# Patient Record
Sex: Male | Born: 1940 | Race: White | Hispanic: No | Smoking: Never smoker
Health system: Southern US, Community
[De-identification: ages and names within clinical notes are randomized; demographics above are authoritative.]

## PROBLEM LIST (undated history)

## (undated) DIAGNOSIS — K219 Gastro-esophageal reflux disease without esophagitis: Secondary | ICD-10-CM

## (undated) DIAGNOSIS — I4891 Unspecified atrial fibrillation: Secondary | ICD-10-CM

## (undated) DIAGNOSIS — I059 Rheumatic mitral valve disease, unspecified: Secondary | ICD-10-CM

## (undated) DIAGNOSIS — Z7901 Long term (current) use of anticoagulants: Secondary | ICD-10-CM

## (undated) DIAGNOSIS — C61 Malignant neoplasm of prostate: Secondary | ICD-10-CM

## (undated) DIAGNOSIS — M109 Gout, unspecified: Secondary | ICD-10-CM

## (undated) DIAGNOSIS — E039 Hypothyroidism, unspecified: Secondary | ICD-10-CM

## (undated) DIAGNOSIS — K635 Polyp of colon: Secondary | ICD-10-CM

## (undated) DIAGNOSIS — I472 Ventricular tachycardia, unspecified: Secondary | ICD-10-CM

## (undated) DIAGNOSIS — I5022 Chronic systolic (congestive) heart failure: Secondary | ICD-10-CM

## (undated) DIAGNOSIS — G2581 Restless legs syndrome: Secondary | ICD-10-CM

## (undated) DIAGNOSIS — L570 Actinic keratosis: Secondary | ICD-10-CM

## (undated) DIAGNOSIS — N183 Chronic kidney disease, stage 3 unspecified: Secondary | ICD-10-CM

## (undated) DIAGNOSIS — L219 Seborrheic dermatitis, unspecified: Secondary | ICD-10-CM

## (undated) DIAGNOSIS — E559 Vitamin D deficiency, unspecified: Secondary | ICD-10-CM

## (undated) DIAGNOSIS — I4892 Unspecified atrial flutter: Secondary | ICD-10-CM

## (undated) DIAGNOSIS — I739 Peripheral vascular disease, unspecified: Secondary | ICD-10-CM

## (undated) DIAGNOSIS — Z95810 Presence of automatic (implantable) cardiac defibrillator: Secondary | ICD-10-CM

## (undated) DIAGNOSIS — Z8673 Personal history of transient ischemic attack (TIA), and cerebral infarction without residual deficits: Secondary | ICD-10-CM

## (undated) DIAGNOSIS — I4729 Other ventricular tachycardia: Secondary | ICD-10-CM

## (undated) DIAGNOSIS — Z85828 Personal history of other malignant neoplasm of skin: Secondary | ICD-10-CM

## (undated) DIAGNOSIS — M899 Disorder of bone, unspecified: Secondary | ICD-10-CM

## (undated) DIAGNOSIS — D696 Thrombocytopenia, unspecified: Secondary | ICD-10-CM

## (undated) DIAGNOSIS — Z4682 Encounter for fitting and adjustment of non-vascular catheter: Secondary | ICD-10-CM

## (undated) DIAGNOSIS — D649 Anemia, unspecified: Secondary | ICD-10-CM

## (undated) DIAGNOSIS — M949 Disorder of cartilage, unspecified: Secondary | ICD-10-CM

## (undated) DIAGNOSIS — G459 Transient cerebral ischemic attack, unspecified: Secondary | ICD-10-CM

## (undated) DIAGNOSIS — M8580 Other specified disorders of bone density and structure, unspecified site: Secondary | ICD-10-CM

## (undated) DIAGNOSIS — C189 Malignant neoplasm of colon, unspecified: Secondary | ICD-10-CM

## (undated) DIAGNOSIS — G4733 Obstructive sleep apnea (adult) (pediatric): Secondary | ICD-10-CM

## (undated) DIAGNOSIS — R7302 Impaired glucose tolerance (oral): Secondary | ICD-10-CM

## (undated) DIAGNOSIS — I779 Disorder of arteries and arterioles, unspecified: Secondary | ICD-10-CM

## (undated) DIAGNOSIS — C801 Malignant (primary) neoplasm, unspecified: Secondary | ICD-10-CM

## (undated) HISTORY — PX: CARDIAC DEFIBRILLATOR PLACEMENT: SHX171

---

## 2011-11-18 DIAGNOSIS — Z8673 Personal history of transient ischemic attack (TIA), and cerebral infarction without residual deficits: Secondary | ICD-10-CM | POA: Insufficient documentation

## 2011-11-18 DIAGNOSIS — I480 Paroxysmal atrial fibrillation: Secondary | ICD-10-CM | POA: Insufficient documentation

## 2012-04-19 DIAGNOSIS — Z950 Presence of cardiac pacemaker: Secondary | ICD-10-CM | POA: Insufficient documentation

## 2012-04-19 DIAGNOSIS — I5022 Chronic systolic (congestive) heart failure: Secondary | ICD-10-CM | POA: Insufficient documentation

## 2012-05-31 DIAGNOSIS — E039 Hypothyroidism, unspecified: Secondary | ICD-10-CM | POA: Insufficient documentation

## 2013-12-14 DIAGNOSIS — Z95810 Presence of automatic (implantable) cardiac defibrillator: Secondary | ICD-10-CM | POA: Insufficient documentation

## 2015-12-22 ENCOUNTER — Ambulatory Visit
Admission: EM | Admit: 2015-12-22 | Discharge: 2015-12-22 | Disposition: A | Discharge status: Home / Self Care | Payer: Medicare Other | Attending: Emergency Medicine | Admitting: Emergency Medicine

## 2015-12-22 DIAGNOSIS — R58 Hemorrhage, not elsewhere classified: Secondary | ICD-10-CM | POA: Diagnosis not present

## 2015-12-22 HISTORY — DX: Actinic keratosis: L57.0

## 2015-12-22 HISTORY — DX: Vitamin D deficiency, unspecified: E55.9

## 2015-12-22 HISTORY — DX: Transient cerebral ischemic attack, unspecified: G45.9

## 2015-12-22 HISTORY — DX: Ventricular tachycardia: I47.2

## 2015-12-22 HISTORY — DX: Ventricular tachycardia, unspecified: I47.20

## 2015-12-22 HISTORY — DX: Long term (current) use of anticoagulants: Z79.01

## 2015-12-22 HISTORY — DX: Personal history of transient ischemic attack (TIA), and cerebral infarction without residual deficits: Z86.73

## 2015-12-22 HISTORY — DX: Malignant neoplasm of colon, unspecified: C18.9

## 2015-12-22 HISTORY — DX: Restless legs syndrome: G25.81

## 2015-12-22 HISTORY — DX: Obstructive sleep apnea (adult) (pediatric): G47.33

## 2015-12-22 HISTORY — DX: Unspecified atrial flutter: I48.92

## 2015-12-22 HISTORY — DX: Anemia, unspecified: D64.9

## 2015-12-22 HISTORY — DX: Polyp of colon: K63.5

## 2015-12-22 HISTORY — DX: Disorder of cartilage, unspecified: M94.9

## 2015-12-22 HISTORY — DX: Malignant neoplasm of prostate: C61

## 2015-12-22 HISTORY — DX: Thrombocytopenia, unspecified: D69.6

## 2015-12-22 HISTORY — DX: Malignant (primary) neoplasm, unspecified: C80.1

## 2015-12-22 HISTORY — DX: Gout, unspecified: M10.9

## 2015-12-22 HISTORY — DX: Hypothyroidism, unspecified: E03.9

## 2015-12-22 HISTORY — DX: Impaired glucose tolerance (oral): R73.02

## 2015-12-22 HISTORY — DX: Chronic kidney disease, stage 3 unspecified: N18.30

## 2015-12-22 HISTORY — DX: Chronic kidney disease, stage 3 (moderate): N18.3

## 2015-12-22 HISTORY — DX: Disorder of arteries and arterioles, unspecified: I77.9

## 2015-12-22 HISTORY — DX: Chronic systolic (congestive) heart failure: I50.22

## 2015-12-22 HISTORY — DX: Presence of automatic (implantable) cardiac defibrillator: Z95.810

## 2015-12-22 HISTORY — DX: Other specified disorders of bone density and structure, unspecified site: M85.80

## 2015-12-22 HISTORY — DX: Peripheral vascular disease, unspecified: I73.9

## 2015-12-22 HISTORY — DX: Seborrheic dermatitis, unspecified: L21.9

## 2015-12-22 HISTORY — DX: Unspecified atrial fibrillation: I48.91

## 2015-12-22 HISTORY — DX: Disorder of bone, unspecified: M89.9

## 2015-12-22 HISTORY — DX: Gastro-esophageal reflux disease without esophagitis: K21.9

## 2015-12-22 HISTORY — DX: Rheumatic mitral valve disease, unspecified: I05.9

## 2015-12-22 HISTORY — DX: Personal history of other malignant neoplasm of skin: Z85.828

## 2015-12-22 HISTORY — DX: Other ventricular tachycardia: I47.29

## 2015-12-22 HISTORY — DX: Encounter for fitting and adjustment of non-vascular catheter: Z46.82

## 2015-12-22 NOTE — ED Provider Notes (Signed)
. HPI  SUBJECTIVE:  Peter Wang is a 75 y.o. male who presents with persistent postoperative bleeding. 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 He is on aliquots for atrial fibrillation. He is states that he has replace the bandages over 3 times, states that the bleeding "is not slowing down". He denies fevers, swelling. He reports mild pain described as soreness at the surgery site, but this is not changed since his surgery. He has tried changing his dressing, applying direct pressure for 20 minutes, which she states didn't help. He has also tried petroleum jelly. There are no other aggravating or alleviating factors. Patient has a extensive past medical history including atrial fibrillation/A flutter, status post defibrillator, prostate cancer, chronic kidney disease stage 3, melanoma. No history of diabetes, hypertension. Patient states that he had the surgery done at the [facility] dermatology clinic by Dr. Marolyn Hammock. PMD: Dr. Raul Del.    Past Medical History:  Diagnosis Date  . Actinic keratosis   . AICD (automatic cardioverter/defibrillator) present   . Anemia   . Anticoagulant long-term use   . 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 Atrial fibrillation ([place])   . Atrial flutter ([place])   . Automatic implantable cardioverter-defibrillator in situ   . Cancer ([place])   . Cancer of prostate ([place])   . Carotid artery disease ([place])   . Chronic kidney disease, stage III (moderate)   . Chronic systolic heart failure ([place])   . Decreased platelet count ([place])   . Disorder of arteries and arterioles ([place])   . Disorder of bone and cartilage   . Encounter for biliary drainage tube placement   . Gastroesophageal reflux disease   . Gout   . H/O malignant neoplasm of skin   . H/O transient cerebral ischemia   . Hypothyroidism   . Impaired glucose tolerance   . Malignant neoplasm of colon ([place])   . Malignant neoplasm of prostate ([place])   . Mitral valve disorder   . Obstructive sleep  apnea syndrome   . Osteopenia   . Paroxysmal ventricular tachycardia ([place])   . Polyp of colon   . Restless leg syndrome   . Seborrheic dermatitis   . Thrombocytopenia ([place])   . TIA (transient ischemic attack)   . Ventricular tachyarrhythmia ([place])   . Vitamin D deficiency     History reviewed. No pertinent surgical history.  History reviewed. No pertinent family history.  Social History  Substance Use Topics  . Smoking status: Never Smoker  . Smokeless tobacco: Never Used  . Alcohol use No    No current facility-administered medications for this encounter.   Current Outpatient Prescriptions:  .  albuterol-ipratropium (COMBIVENT) 18-103 MCG/ACT inhaler, Inhale into the lungs every 4 (four) hours., Disp: , Rfl:  .  alclomethasone (ACLOVATE) 0.05 % cream, Apply topically as needed., Disp: , Rfl:  .  allopurinol (ZYLOPRIM) 300 MG tablet, Take 300 mg by mouth daily., Disp: , Rfl:  .  ALPRAZolam (XANAX) 0.25 MG tablet, Take 0.25 mg by mouth at bedtime as needed for anxiety., Disp: , Rfl:  .  amiodarone (PACERONE) 200 MG tablet, Take 200 mg by mouth as needed., Disp: , Rfl:  .  apixaban (ELIQUIS) 5 MG TABS tablet, Take 5 mg by mouth as needed., Disp: , Rfl:  .  aspirin 81 MG chewable tablet, Chew by mouth daily., Disp: , Rfl:  .  benzonatate (TESSALON) 100 MG capsule, Take by mouth 3 (three) times daily as needed  for cough., Disp: , Rfl:  .  calcium carbonate (OSCAL) 1500 (600 Ca) MG TABS tablet, Take 600 mg of elemental calcium by mouth 4 (four) times daily., Disp: , Rfl:  .  carvedilol (COREG) 25 MG tablet, Take 25 mg by mouth as needed., Disp: , Rfl:  .  chlorpheniramine (CHLOR-TRIMETON) 4 MG tablet, Take 4 mg by mouth as needed for allergies., Disp: , Rfl:  .  cholecalciferol (VITAMIN D) 400 units TABS tablet, Take 2,000 Units by mouth daily., Disp: , Rfl:  .  clindamycin (CLINDAGEL) 1 % gel, Apply topically 2 (two) times daily., Disp: , Rfl:  .  cyanocobalamin 1000 MCG tablet,  Take 1,000 mcg by mouth daily., Disp: , Rfl:  .  desonide (DESOWEN) 0.05 % lotion, Apply topically 2 (two) times daily., Disp: , Rfl:  .  digoxin (LANOXIN) 0.25 MG tablet, Take 250 mcg by mouth daily., Disp: , Rfl:  .  diltiazem (DILACOR XR) 120 MG 24 hr capsule, Take 120 mg by mouth daily., Disp: , Rfl:  .  docusate sodium (COLACE) 100 MG capsule, Take 100 mg by mouth 2 (two) times daily., Disp: , Rfl:  .  dronedarone (MULTAQ) 400 MG tablet, Take 400 mg by mouth as needed., Disp: , Rfl:  .  enalapril (VASOTEC) 2.5 MG tablet, Take 2.5 mg by mouth daily., Disp: , Rfl:  .  enoxaparin (LOVENOX) 100 MG/ML injection, Inject 100 mg into the skin., Disp: , Rfl:  .  ferrous sulfate 325 (65 FE) MG tablet, Take 325 mg by mouth daily with breakfast., Disp: , Rfl:  .  flecainide (TAMBOCOR) 50 MG tablet, Take 50 mg by mouth 2 (two) times daily., Disp: , Rfl:  .  fluocinonide cream (LIDEX) [id number] %, Apply 1 application topically 2 (two) times daily., Disp: , Rfl:  .  fluorouracil (EFUDEX) 5 % cream, Apply topically 2 (two) times daily., Disp: , Rfl:  .  fluticasone (FLONASE) 50 MCG/ACT nasal spray, Place into both nostrils daily., Disp: , Rfl:  .  Fluticasone-Salmeterol (ADVAIR) 250-50 MCG/DOSE AEPB, Inhale 1 puff into the lungs 2 (two) times daily., Disp: , Rfl:  .  furosemide (LASIX) 40 MG tablet, Take 40 mg by mouth., Disp: , Rfl:  .  HYDROcodone-acetaminophen (NORCO/VICODIN) 5-325 MG tablet, Take 1 tablet by mouth as needed for moderate pain., Disp: , Rfl:  .  hydrocortisone 2.5 % cream, Apply topically 2 (two) times daily., Disp: , Rfl:  .  ketoconazole (NIZORAL) 2 % shampoo, Apply 1 application topically as needed for irritation., Disp: , Rfl:  .  leuprolide, 6 Month, (ELIGARD) 45 MG injection, Inject 45 mg into the skin every 6 (six) months., Disp: , Rfl:  .  levothyroxine (SYNTHROID, LEVOTHROID) 75 MCG tablet, Take 75 mcg by mouth daily before breakfast., Disp: , Rfl:  .  loratadine (CLARITIN) 10 MG  tablet, Take 10 mg by mouth daily., Disp: , Rfl:  .  magnesium oxide (MAG-OX) 400 MG tablet, Take 400 mg by mouth daily., Disp: , Rfl:  .  metoprolol succinate (TOPROL-XL) 50 MG 24 hr tablet, Take 50 mg by mouth daily. Take with or immediately following a meal., Disp: , Rfl:  .  mexiletine (MEXITIL) 150 MG capsule, Take 150 mg by mouth daily., Disp: , Rfl:  .  Miconazole-Zinc Oxide-Petrolat 0.25-15-81.35 % OINT, Apply topically 2 (two) times daily., Disp: , Rfl:  .  nystatin (MYCOSTATIN/NYSTOP) powder, Apply topically 2 (two) times daily., Disp: , Rfl:  .  omeprazole (PRILOSEC) 40 MG capsule,  Take 40 mg by mouth daily., Disp: , Rfl:  .  ranitidine (ZANTAC) 150 MG tablet, Take 150 mg by mouth 2 (two) times daily., Disp: , Rfl:  .  salicylic acid 6 % gel, Apply 1 application topically 2 (two) times daily., Disp: , Rfl:  .  Sulfacetamide Sodium-Sulfur 10-5 % LOTN, Apply topically 3 times/day as needed-between meals & bedtime., Disp: , Rfl:   Allergies  Allergen Reactions  . Benadryl [Diphenhydramine Hcl] Other (See Comments)    Shakes   . Ibuprofen Other (See Comments)    Confusion  . Interferons Rash  . Oxycodone Rash     ROS  As noted in HPI.   Physical Exam  BP (!) 145/60 (BP Location: Right Arm)   Pulse 70   Temp 97.6 F (36.4 C) (Tympanic)   Resp 18   Ht 5\' 6"  (1.676 m)   Wt 189 lb (85.7 kg)   SpO2 100%   BMI 30.51 kg/m   Constitutional: Well developed, well nourished, no acute distress Eyes:  EOMI, conjunctiva normal bilaterally HENT: Normocephalic, atraumatic,mucus membranes moist. Positive wheezing underneath the bandage at [facility], bandage partially soaked through on the cheek.  Respiratory: Normal inspiratory effort Cardiovascular: Normal rate GI: nondistended skin: No rash, skin intact Musculoskeletal: no deformities Neurologic: Alert & oriented x 3, no focal neuro deficits Psychiatric: Speech and behavior appropriate   ED Course   Medications - No data  to display  No orders of the defined types were placed in this encounter.   No results found for this or any previous visit (from the past 24 hour(s)). No results found.  ED Clinical Impression  Bleeding   ED Assessment/Plan  Discussed case with Dr. Venia Carbon, dermatology resident on call. He advised removing the bandages and to use silver nitrate or any other hemostatic agent if patient has persistent bleeding. removed bandages, patient has stable clot in the upper lesion does not appear to have any active bleeding, and the lower incision appears to be  healing excellently. No purulent drainage, erythema, tenderness. No signs of infection. He does have a small nontender bruise inferior to the laceration.  Leaving the clot as a biologic bandage.  He is not bleeding from the incision on his cheek. Bacitracin and pressure dressing over the wound on the cheek as well. Follow-up with dermatology on Monday. Go to the ER for recurrent bleeding that does not stop after 20 minutes of direct pressure. Patient agrees with plan.   *This clinic note was created using Dragon dictation software. Therefore, there may be occasional mistakes despite careful proofreading.  ?   Melynda Ripple, MD 12/22/15 1736

## 2015-12-22 NOTE — Discharge Instructions (Signed)
Sleep with your head elevated, do not touch the bandage if at all possible. If it starts bleeding again, apply pressure for 20 minutes. If this does not resolve it, then go to the emergency department. Also go to the emergency department for fevers above 100.4, pain not controlled with medications, or other concerns. Otherwise follow-up with your dermatologist on Monday.

## 2015-12-22 NOTE — ED Triage Notes (Signed)
Patient had dermatology surgery on Thursday on his face, he presents today with dressings in place that are covered with some dried drainage. He would like to have the bandages changed if possible.

## 2016-11-16 ENCOUNTER — Ambulatory Visit
Admission: EM | Admit: 2016-11-16 | Discharge: 2016-11-16 | Disposition: A | Discharge status: Home / Self Care | Payer: Medicare Other | Attending: Emergency Medicine | Admitting: Emergency Medicine

## 2016-11-16 ENCOUNTER — Ambulatory Visit: Payer: Medicare Other

## 2016-11-16 DIAGNOSIS — K219 Gastro-esophageal reflux disease without esophagitis: Secondary | ICD-10-CM | POA: Diagnosis not present

## 2016-11-16 DIAGNOSIS — Z8673 Personal history of transient ischemic attack (TIA), and cerebral infarction without residual deficits: Secondary | ICD-10-CM | POA: Diagnosis not present

## 2016-11-16 DIAGNOSIS — Z85038 Personal history of other malignant neoplasm of large intestine: Secondary | ICD-10-CM | POA: Insufficient documentation

## 2016-11-16 DIAGNOSIS — M109 Gout, unspecified: Secondary | ICD-10-CM | POA: Insufficient documentation

## 2016-11-16 DIAGNOSIS — R509 Fever, unspecified: Secondary | ICD-10-CM | POA: Diagnosis present

## 2016-11-16 DIAGNOSIS — Z7982 Long term (current) use of aspirin: Secondary | ICD-10-CM | POA: Insufficient documentation

## 2016-11-16 DIAGNOSIS — Z95810 Presence of automatic (implantable) cardiac defibrillator: Secondary | ICD-10-CM | POA: Insufficient documentation

## 2016-11-16 DIAGNOSIS — Z8546 Personal history of malignant neoplasm of prostate: Secondary | ICD-10-CM | POA: Diagnosis not present

## 2016-11-16 DIAGNOSIS — I5022 Chronic systolic (congestive) heart failure: Secondary | ICD-10-CM | POA: Insufficient documentation

## 2016-11-16 DIAGNOSIS — Z85828 Personal history of other malignant neoplasm of skin: Secondary | ICD-10-CM | POA: Diagnosis not present

## 2016-11-16 DIAGNOSIS — G4733 Obstructive sleep apnea (adult) (pediatric): Secondary | ICD-10-CM | POA: Diagnosis not present

## 2016-11-16 DIAGNOSIS — I4891 Unspecified atrial fibrillation: Secondary | ICD-10-CM | POA: Insufficient documentation

## 2016-11-16 DIAGNOSIS — Z86010 Personal history of colonic polyps: Secondary | ICD-10-CM | POA: Insufficient documentation

## 2016-11-16 DIAGNOSIS — J188 Other pneumonia, unspecified organism: Secondary | ICD-10-CM | POA: Insufficient documentation

## 2016-11-16 DIAGNOSIS — J189 Pneumonia, unspecified organism: Secondary | ICD-10-CM

## 2016-11-16 DIAGNOSIS — E039 Hypothyroidism, unspecified: Secondary | ICD-10-CM | POA: Diagnosis not present

## 2016-11-16 DIAGNOSIS — E559 Vitamin D deficiency, unspecified: Secondary | ICD-10-CM | POA: Diagnosis not present

## 2016-11-16 DIAGNOSIS — M8580 Other specified disorders of bone density and structure, unspecified site: Secondary | ICD-10-CM | POA: Diagnosis not present

## 2016-11-16 DIAGNOSIS — I4892 Unspecified atrial flutter: Secondary | ICD-10-CM | POA: Diagnosis not present

## 2016-11-16 DIAGNOSIS — N183 Chronic kidney disease, stage 3 (moderate): Secondary | ICD-10-CM | POA: Diagnosis not present

## 2016-11-16 DIAGNOSIS — I2510 Atherosclerotic heart disease of native coronary artery without angina pectoris: Secondary | ICD-10-CM | POA: Insufficient documentation

## 2016-11-16 DIAGNOSIS — Z7901 Long term (current) use of anticoagulants: Secondary | ICD-10-CM | POA: Insufficient documentation

## 2016-11-16 DIAGNOSIS — G2581 Restless legs syndrome: Secondary | ICD-10-CM | POA: Diagnosis not present

## 2016-11-16 DIAGNOSIS — J181 Lobar pneumonia, unspecified organism: Secondary | ICD-10-CM

## 2016-11-16 LAB — URINALYSIS, COMPLETE (UACMP) WITH MICROSCOPIC
Bacteria, UA: NONE SEEN
GLUCOSE, UA: NEGATIVE mg/dL
HGB URINE DIPSTICK: NEGATIVE
KETONES UR: NEGATIVE mg/dL
LEUKOCYTES UA: NEGATIVE
NITRITE: NEGATIVE
Specific Gravity, Urine: 1.02 (ref 1.005–1.030)
pH: 5.5 (ref 5.0–8.0)

## 2016-11-16 LAB — CBC WITH DIFFERENTIAL/PLATELET
BASOS ABS: 0.1 10*3/uL (ref 0–0.1)
Basophils Relative: 1 %
EOS ABS: 0 10*3/uL (ref 0–0.7)
EOS PCT: 0 %
HCT: 37.5 % — ABNORMAL LOW (ref 40.0–52.0)
Hemoglobin: 12.8 g/dL — ABNORMAL LOW (ref 13.0–18.0)
LYMPHS PCT: 6 %
Lymphs Abs: 0.6 10*3/uL — ABNORMAL LOW (ref 1.0–3.6)
MCH: 32.4 pg (ref 26.0–34.0)
MCHC: 34.2 g/dL (ref 32.0–36.0)
MCV: 94.9 fL (ref 80.0–100.0)
Monocytes Absolute: 1.1 10*3/uL — ABNORMAL HIGH (ref 0.2–1.0)
Monocytes Relative: 11 %
Neutro Abs: 8 10*3/uL — ABNORMAL HIGH (ref 1.4–6.5)
Neutrophils Relative %: 82 %
PLATELETS: 98 10*3/uL — AB (ref 150–440)
RBC: 3.96 MIL/uL — ABNORMAL LOW (ref 4.40–5.90)
RDW: 14.5 % (ref 11.5–14.5)
WBC: 9.7 10*3/uL (ref 3.8–10.6)

## 2016-11-16 LAB — BASIC METABOLIC PANEL
ANION GAP: 8 (ref 5–15)
BUN: 27 mg/dL — ABNORMAL HIGH (ref 6–20)
CALCIUM: 8.7 mg/dL — AB (ref 8.9–10.3)
CO2: 22 mmol/L (ref 22–32)
Chloride: 101 mmol/L (ref 101–111)
Creatinine, Ser: 1.41 mg/dL — ABNORMAL HIGH (ref 0.61–1.24)
GFR calc Af Amer: 54 mL/min — ABNORMAL LOW (ref >60)
GFR calc non Af Amer: 47 mL/min — ABNORMAL LOW (ref >60)
GLUCOSE: 135 mg/dL — AB (ref 65–99)
Potassium: 4.6 mmol/L (ref 3.5–5.1)
Sodium: 131 mmol/L — ABNORMAL LOW (ref 135–145)

## 2016-11-16 MED ORDER — DOXYCYCLINE HYCLATE 100 MG PO CAPS
100.0000 mg | ORAL_CAPSULE | Freq: Two times a day (BID) | ORAL | 0 refills | Status: DC
Start: 2016-11-16 — End: 2024-01-12

## 2016-11-16 MED ORDER — ACETAMINOPHEN 325 MG PO TABS
650.0000 mg | ORAL_TABLET | Freq: Once | ORAL | Status: AC
  Administered 2016-11-16: 650 mg via ORAL

## 2016-11-16 NOTE — ED Provider Notes (Signed)
[facility] ____________________________________________  Time seen: Approximately 11:55 AM  I have reviewed the triage vital signs and the nursing notes.   HISTORY  Chief Complaint Chills and Fever   HPI Peter Wang is a 76 y.o. male with a history of A. fib, a flutter, CAD, anemia, chronic renal disease, CHF, thrombocytopenia, colon cancer, prostate cancer with prostatectomy, presenting with his wife at bedside for evaluation of fever and chills for the last 4 days at home. Reports some intermittent complaints of weakness, denies current weakness. States did not measure temperature at home as a thermometer is not working. Reports has been taking Tylenol about twice a day. Has not taken any Tylenol prior to arrival. Reports was recently treated for urinary tract infection, and in discussing with patient patient took the 250 mg Cipro once a day for 14 days instead of twice a day for 7. Reports urinary symptoms including urgency and burning with urination has fully resolved. Denies any urinary complaints at this time.  Reports patient has had cough and some chest congestion for the last 1-2 weeks. Reports felt like it was just from working outside, states occasionally cough is productive. Denies sinus pain, sinus pressure or sore throat.reports has continued but does not feel like he has as much energy as normal. Denies rash or skin changes. Denies fall or injury. Reports has had multiple ticks on him lately, denies known tick bite or tick attachment. denies aggravating or alleviating factors.   Denies chest pain, shortness of breath, abdominal pain, dysuria, extremity pain, extremity swelling or rash.   Leigh-Fleming, Mary Sella, MD: PCP    Past Medical History:  Diagnosis Date  . Actinic keratosis   . AICD (automatic cardioverter/defibrillator) present   . Anemia   . Anticoagulant long-term use   . Arterial disease ([place])   . Atrial fibrillation ([place])   . Atrial flutter ([place])     . Automatic implantable cardioverter-defibrillator in situ   . Cancer ([place])   . Cancer of prostate ([place])   . Carotid artery disease ([place])   . Chronic kidney disease, stage III (moderate)   . Chronic systolic heart failure ([place])   . Decreased platelet count ([place])   . Disorder of arteries and arterioles ([employer])   . Disorder of bone and cartilage   . Encounter for biliary drainage tube placement   . Gastroesophageal reflux disease   . Gout   . H/O malignant neoplasm of skin   . H/O transient cerebral ischemia   . Hypothyroidism   . Impaired glucose tolerance   . Malignant neoplasm of colon ([place])   . Malignant neoplasm of prostate ([place])   . Mitral valve disorder   . Obstructive sleep apnea syndrome   . Osteopenia   . Paroxysmal ventricular tachycardia ([place])   . Polyp of colon   . Restless leg syndrome   . Seborrheic dermatitis   . Thrombocytopenia ([place])   . TIA (transient ischemic attack)   . Ventricular tachyarrhythmia ([place])   . Vitamin D deficiency     There are no active problems to display for this patient.   Past Surgical History:  Procedure Laterality Date  . CARDIAC DEFIBRILLATOR PLACEMENT       No current facility-administered medications for this encounter.   Current Outpatient Prescriptions:  .  albuterol-ipratropium (COMBIVENT) 18-103 MCG/ACT inhaler, Inhale into the lungs every 4 (four) hours., Disp: , Rfl:  .  alclomethasone (ACLOVATE) 0.05 % cream, Apply topically as needed., Disp: , Rfl:  .  allopurinol (ZYLOPRIM) 300 MG tablet, Take 300 mg by mouth daily., Disp: , Rfl:  .  ALPRAZolam (XANAX) 0.25 MG tablet, Take 0.25 mg by mouth at bedtime as needed for anxiety., Disp: , Rfl:  .  amiodarone (PACERONE) 200 MG tablet, Take 200 mg by mouth as needed., Disp: , Rfl:  .  apixaban (ELIQUIS) 5 MG TABS tablet, Take 5 mg by mouth as needed., Disp: , Rfl:  .  aspirin 81 MG chewable tablet, Chew by mouth daily., Disp: , Rfl:  .  benzonatate (TESSALON) 100 MG capsule,  Take by mouth 3 (three) times daily as needed for cough., Disp: , Rfl:  .  calcium carbonate (OSCAL) 1500 (600 Ca) MG TABS tablet, Take 600 mg of elemental calcium by mouth 4 (four) times daily., Disp: , Rfl:  .  carvedilol (COREG) 25 MG tablet, Take 25 mg by mouth as needed., Disp: , Rfl:  .  chlorpheniramine (CHLOR-TRIMETON) 4 MG tablet, Take 4 mg by mouth as needed for allergies., Disp: , Rfl:  .  cholecalciferol (VITAMIN D) 400 units TABS tablet, Take 2,000 Units by mouth daily., Disp: , Rfl:  .  clindamycin (CLINDAGEL) 1 % gel, Apply topically 2 (two) times daily., Disp: , Rfl:  .  cyanocobalamin 1000 MCG tablet, Take 1,000 mcg by mouth daily., Disp: , Rfl:  .  desonide (DESOWEN) 0.05 % lotion, Apply topically 2 (two) times daily., Disp: , Rfl:  .  digoxin (LANOXIN) 0.25 MG tablet, Take 250 mcg by mouth daily., Disp: , Rfl:  .  diltiazem (DILACOR XR) 120 MG 24 hr capsule, Take 120 mg by mouth daily., Disp: , Rfl:  .  docusate sodium (COLACE) 100 MG capsule, Take 100 mg by mouth 2 (two) times daily., Disp: , Rfl:  .  doxycycline (VIBRAMYCIN) 100 MG capsule, Take 1 capsule (100 mg total) by mouth 2 (two) times daily., Disp: 20 capsule, Rfl: 0 .  dronedarone (MULTAQ) 400 MG tablet, Take 400 mg by mouth as needed., Disp: , Rfl:  .  enalapril (VASOTEC) 2.5 MG tablet, Take 2.5 mg by mouth daily., Disp: , Rfl:  .  enoxaparin (LOVENOX) 100 MG/ML injection, Inject 100 mg into the skin., Disp: , Rfl:  .  ferrous sulfate 325 (65 FE) MG tablet, Take 325 mg by mouth daily with breakfast., Disp: , Rfl:  .  flecainide (TAMBOCOR) 50 MG tablet, Take 50 mg by mouth 2 (two) times daily., Disp: , Rfl:  .  fluocinonide cream (LIDEX) 2.72 %, Apply 1 application topically 2 (two) times daily., Disp: , Rfl:  .  fluorouracil (EFUDEX) 5 % cream, Apply topically 2 (two) times daily., Disp: , Rfl:  .  fluticasone (FLONASE) 50 MCG/ACT nasal spray, Place into both nostrils daily., Disp: , Rfl:  .  Fluticasone-Salmeterol  (ADVAIR) 250-50 MCG/DOSE AEPB, Inhale 1 puff into the lungs 2 (two) times daily., Disp: , Rfl:  .  furosemide (LASIX) 40 MG tablet, Take 40 mg by mouth., Disp: , Rfl:  .  HYDROcodone-acetaminophen (NORCO/VICODIN) 5-325 MG tablet, Take 1 tablet by mouth as needed for moderate pain., Disp: , Rfl:  .  hydrocortisone 2.5 % cream, Apply topically 2 (two) times daily., Disp: , Rfl:  .  ketoconazole (NIZORAL) 2 % shampoo, Apply 1 application topically as needed for irritation., Disp: , Rfl:  .  leuprolide, 6 Month, (ELIGARD) 45 MG injection, Inject 45 mg into the skin every 6 (six) months., Disp: , Rfl:  .  levothyroxine (SYNTHROID, LEVOTHROID) 75 MCG tablet, Take  75 mcg by mouth daily before breakfast., Disp: , Rfl:  .  loratadine (CLARITIN) 10 MG tablet, Take 10 mg by mouth daily., Disp: , Rfl:  .  magnesium oxide (MAG-OX) 400 MG tablet, Take 400 mg by mouth daily., Disp: , Rfl:  .  metoprolol succinate (TOPROL-XL) 50 MG 24 hr tablet, Take 50 mg by mouth daily. Take with or immediately following a meal., Disp: , Rfl:  .  mexiletine (MEXITIL) 150 MG capsule, Take 150 mg by mouth daily., Disp: , Rfl:  .  Miconazole-Zinc Oxide-Petrolat 0.25-15-81.35 % OINT, Apply topically 2 (two) times daily., Disp: , Rfl:  .  nystatin (MYCOSTATIN/NYSTOP) powder, Apply topically 2 (two) times daily., Disp: , Rfl:  .  omeprazole (PRILOSEC) 40 MG capsule, Take 40 mg by mouth daily., Disp: , Rfl:  .  ranitidine (ZANTAC) 150 MG tablet, Take 150 mg by mouth 2 (two) times daily., Disp: , Rfl:  .  salicylic acid 6 % gel, Apply 1 application topically 2 (two) times daily., Disp: , Rfl:  .  Sulfacetamide Sodium-Sulfur 10-5 % LOTN, Apply topically 3 times/day as needed-between meals & bedtime., Disp: , Rfl:   Allergies Benadryl [diphenhydramine hcl]; Ibuprofen; Interferons; and Oxycodone  family history Mother: DM   Social History Social History  Substance Use Topics  . Smoking status: Never Smoker  . Smokeless tobacco:  Never Used  . Alcohol use No    Review of Systems Constitutional: as above. Eyes: No visual changes. ENT: No sore throat. Cardiovascular: Denies chest pain. Respiratory: Denies shortness of breath. Gastrointestinal: No abdominal pain.  No nausea, no vomiting.  No diarrhea.   Genitourinary: Negative for dysuria. Reports some urinary incontinence at baseline. Musculoskeletal: Negative for back pain. Skin: Negative for rash.   ____________________________________________   PHYSICAL EXAM:  VITAL SIGNS: ED Triage Vitals [11/16/16 1118]  Enc Vitals Group     BP (!) 129/58     Pulse Rate 69     Resp (!) 24     Temp (!) 100.8 F (38.2 C)     Temp Source Oral     SpO2 100 %     Weight 180 lb (81.6 kg)     Height 5\' 7"  (1.702 m)     Head Circumference      Peak Flow      Pain Score      Pain Loc      Pain Edu?      Excl. in [place]?    Vitals:   11/16/16 1118 11/16/16 1120 11/16/16 1122 11/16/16 1350  BP: (!) 129/58 124/70 (!) 123/49 (!) 111/56  Pulse: 69 70 69 64  Resp: (!) 24   18  Temp: (!) 100.8 F (38.2 C)   99.4 F (37.4 C)  TempSrc: Oral   Oral  SpO2: 100%   99%  Weight: 180 lb (81.6 kg)     Height: 5\' 7"  (1.702 m)       Constitutional: Alert and oriented. Well appearing and in no acute distress. Eyes: Conjunctivae are normal. ENT      Head: Normocephalic and atraumatic.No sinus tenderness to palpation.       Nose: No congestion/rhinnorhea.      Mouth/Throat: Mucous membranes are moist.Oropharynx non-erythematous. Neck: No stridor. Supple without meningismus.  Hematological/Lymphatic/Immunilogical: No cervical lymphadenopathy. Cardiovascular: Normal rate, regular rhythm. Grossly normal heart sounds.  Good peripheral circulation. Respiratory: Normal respiratory effort without tachypnea nor retractions. occasional dry cough noted in room. Mild scattered rhonchi. No wheezes. Speaks in complete  sentences.  Gastrointestinal: Soft and nontender. Normal Bowel sounds.  No CVA tenderness. Musculoskeletal: No midline cervical, thoracic or lumbar tenderness to palpation. Ambulatory with steady gait.       Right lower leg:  No tenderness or edema.      Left lower leg:  No tenderness or edema.  Neurologic:  Normal speech and language.  Speech is normal. No gait instability.  Skin:  Skin is warm, dry and intact. No rash noted. Psychiatric: Mood and affect are normal. Speech and behavior are normal. Patient exhibits appropriate insight and judgment   ___________________________________________   LABS (all labs ordered are listed, but only abnormal results are displayed)  Labs Reviewed  URINALYSIS, COMPLETE (UACMP) WITH MICROSCOPIC - Abnormal; Notable for the following:       Result Value   Color, Urine AMBER (*)    Bilirubin Urine SMALL (*)    Protein, ur TRACE (*)    Squamous Epithelial / LPF 0-5 (*)    All other components within normal limits  CBC WITH DIFFERENTIAL/PLATELET - Abnormal; Notable for the following:    RBC 3.96 (*)    Hemoglobin 12.8 (*)    HCT 37.5 (*)    Platelets 98 (*)    Neutro Abs 8.0 (*)    Lymphs Abs 0.6 (*)    Monocytes Absolute 1.1 (*)    All other components within normal limits  BASIC METABOLIC PANEL - Abnormal; Notable for the following:    Sodium 131 (*)    Glucose, Bld 135 (*)    BUN 27 (*)    Creatinine, Ser 1.41 (*)    Calcium 8.7 (*)    GFR calc non Af Amer 47 (*)    GFR calc Af Amer 54 (*)    All other components within normal limits  ROCKY MTN SPOTTED FVR ABS PNL(IGG+IGM)  B. BURGDORFI ANTIBODIES   Labs appear to be similar to patient's baseline when compared to labs via care everywhere.   RADIOLOGY  Dg Chest 2 View  Result Date: 11/16/2016 CLINICAL DATA:  Cough and congestion. EXAM: CHEST  2 VIEW COMPARISON:  None. FINDINGS: Mild cardiomegaly. The hila and mediastinum are normal. A 2 lead AICD device is identified. No pneumothorax. A healed rib fractures seen laterally on the right. Underlying opacity is  probably thickened pleural from previous trauma. There also appears to be opacity behind the right hemidiaphragm, located in the right lower lobe on the lateral view consistent with infiltrate. No other acute abnormalities are identified. IMPRESSION: Suspected right lower lobe pneumonia. Recommend short-term follow-up to ensure resolution. Healed rib fractures on the right. Associated underlying opacity is likely pleural thickening. Electronically Signed   By: Dorise Bullion III M.D   On: 11/16/2016 13:20   ____________________________________________   PROCEDURES Procedures     INITIAL IMPRESSION / ASSESSMENT AND PLAN / ED COURSE  Pertinent labs & imaging results that were available during my care of the patient were reviewed by me and considered in my medical decision making (see chart for details).  Well-appearing patient. No acute distress. Tylenol given in urgent care. Urinalysis overall unremarkable, doubt UTI. CBC and BMP results reviewed and discussed in detail with patient and spouse, suspect similar to patient baseline as compared to care everywhere labs, and patient and spouse reports him or to patient baseline. Will evaluate chest x-ray as well as send for RMSF and Lyme disease labs as recent ticks found on him.   Chest x-ray per radiologist suspected right lower lobe pneumonia  as above. As patient with chronic renal insufficiency, recently treated with fluoroquinolone, concern for pneumonia as well as tick exposures, will treat patient empirically with oral doxycycline. Denies need for cough medication. Encouraged rest, fluids, supportive care, Tylenol as needed and very close primary care follow-up. Encouraged follow-up with primary care physician in 2-3 days. Discussed strict follow-up and return parameters including returning to urgent care proceed and directed to the ER for any continued fever, shortness of breath, chest pain, weakness or worsening concerns. Discussed indication,  risks and benefits of medications with patient.  Discussed follow up with Primary care physician this week. Discussed follow up and return parameters including no resolution or any worsening concerns. Patient verbalized understanding and agreed to plan.   ____________________________________________   FINAL CLINICAL IMPRESSION(S) / ED DIAGNOSES  Final diagnoses:  Fever, unspecified  Community acquired pneumonia of right lower lobe of lung ([place])     Discharge Medication List as of 11/16/2016  1:58 PM    START taking these medications   Details  doxycycline (VIBRAMYCIN) 100 MG capsule Take 1 capsule (100 mg total) by mouth 2 (two) times daily., Starting Sun 11/16/2016, Normal        Note: This dictation was prepared with Dragon dictation along with smaller phrase technology. Any transcriptional errors that result from this process are unintentional.         Marylene Land, NP 11/16/16 1811

## 2016-11-16 NOTE — Discharge Instructions (Signed)
Take medication as prescribed. Rest. Drink plenty of fluids.  ° °Follow up with your primary care physician this week as needed. Return to Urgent care for new or worsening concerns.  ° °

## 2016-11-16 NOTE — ED Triage Notes (Signed)
Pt was treated for UTI 10 days and finished ABX and feeling better regarding urinary sx. Fever/chills past 4 days. One episode of feeling lightheaded a few days ago but none since.

## 2016-11-18 LAB — B. BURGDORFI ANTIBODIES

## 2016-11-18 LAB — ROCKY MTN SPOTTED FVR ABS PNL(IGG+IGM)
RMSF IGG: NEGATIVE
RMSF IGM: 0.19 {index} (ref 0.00–0.89)

## 2017-01-04 DIAGNOSIS — I77810 Thoracic aortic ectasia: Secondary | ICD-10-CM | POA: Insufficient documentation

## 2017-12-22 IMAGING — CR DG CHEST 2V
3 series · 3 of 3 positions shown · non-contrast
Comparison: None.

CLINICAL DATA: Cough and congestion.

EXAM:
CHEST  2 VIEW

[chest pa (1 of 2)]
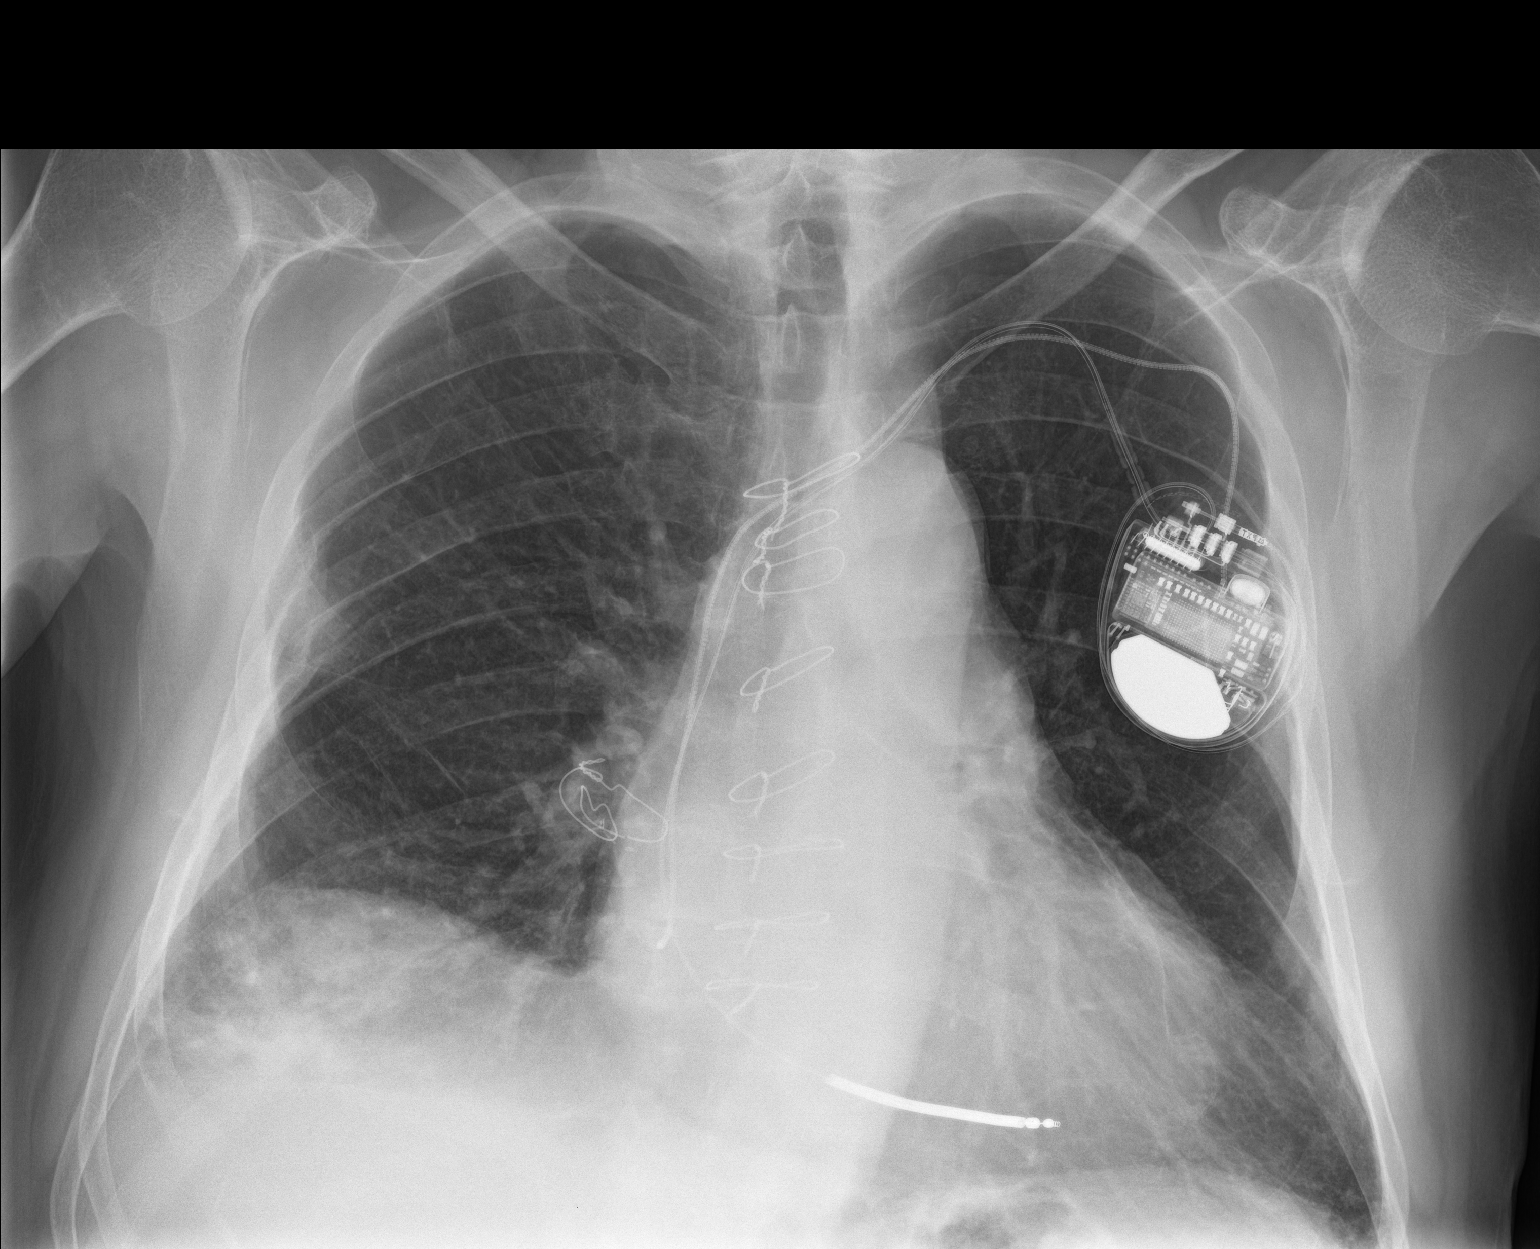

[chest pa (2 of 2)]
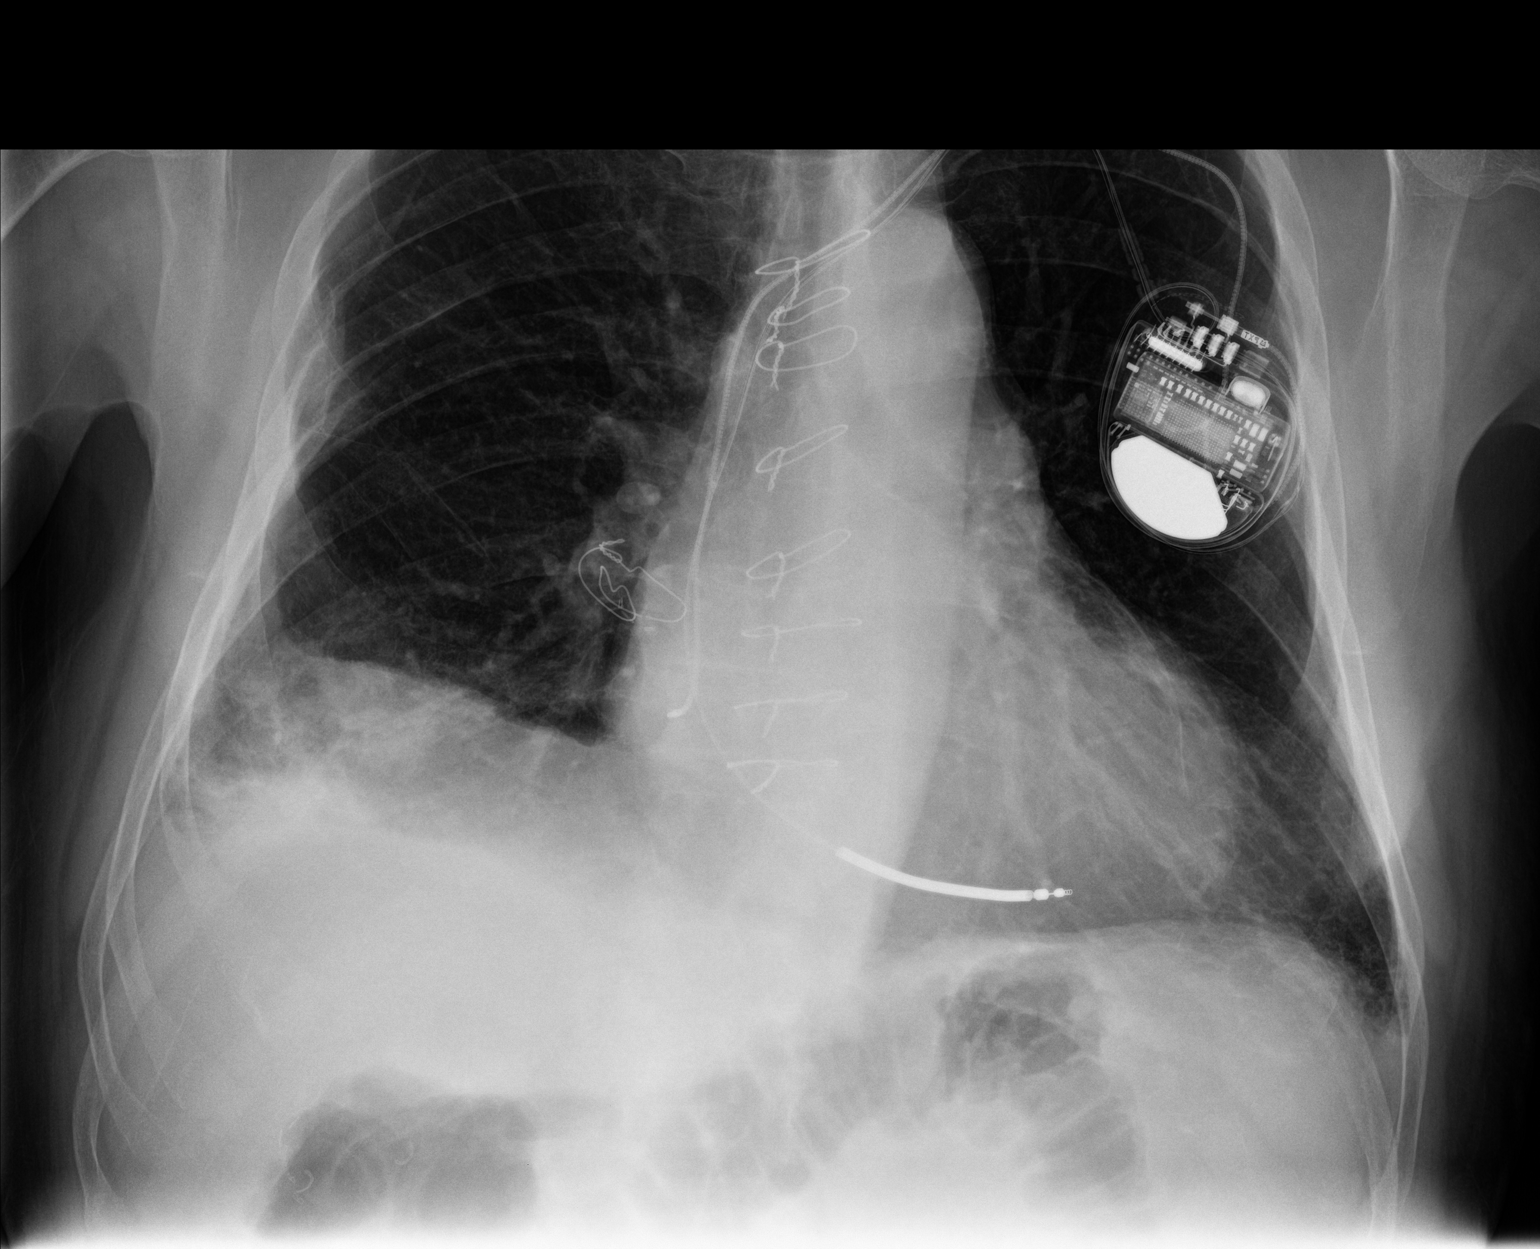

[chest lat]
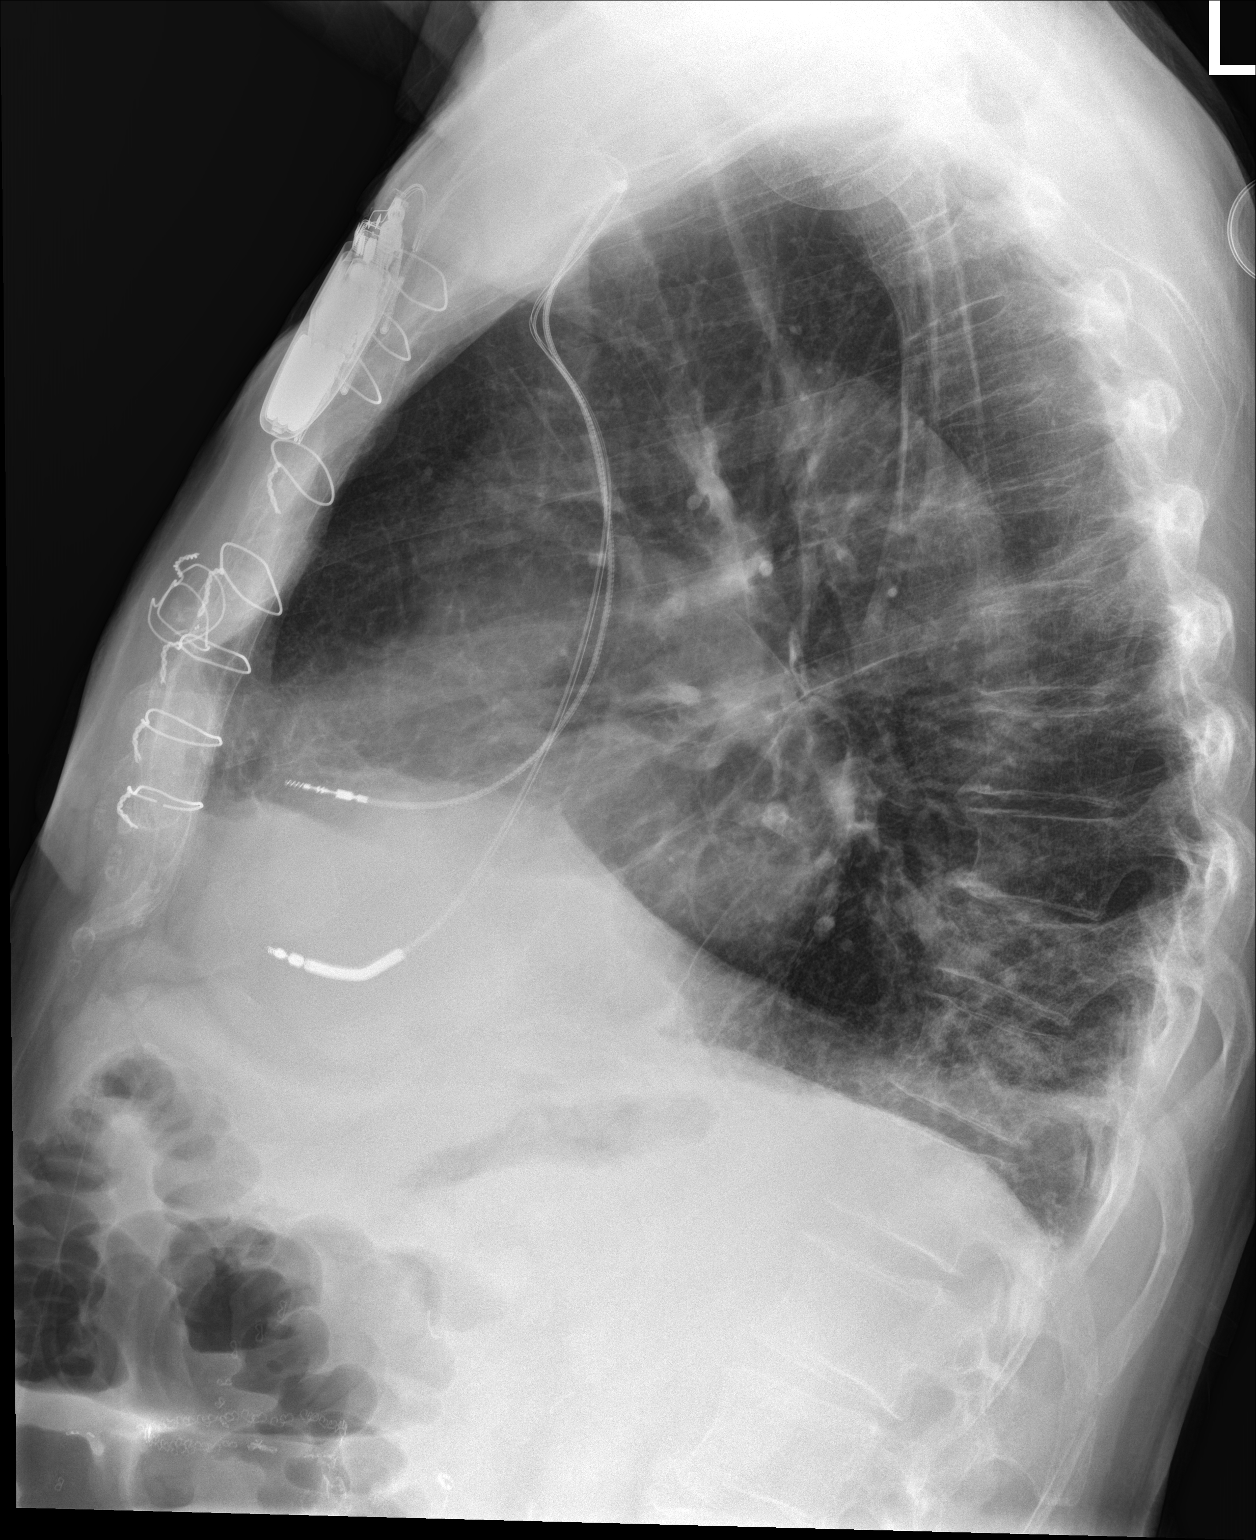

[3 of 3 positions shown; findings below may reference images not displayed]

FINDINGS: Mild cardiomegaly. The hila and mediastinum are normal. A 2 lead
AICD device is identified. No pneumothorax. A healed rib fractures
seen laterally on the right. Underlying opacity is probably
thickened pleural from previous trauma. There also appears to be
opacity behind the right hemidiaphragm, located in the right lower
lobe on the lateral view consistent with infiltrate. No other acute
abnormalities are identified.
IMPRESSION: Suspected right lower lobe pneumonia. Recommend short-term follow-up
to ensure resolution.

Healed rib fractures on the right. Associated underlying opacity is
likely pleural thickening.

## 2020-08-21 DIAGNOSIS — F411 Generalized anxiety disorder: Secondary | ICD-10-CM | POA: Insufficient documentation

## 2024-01-11 NOTE — Progress Notes (Signed)
 Sleep Medicine   Office Visit  Patient Name: Peter Wang DOB: [birth year] MRN [id number]    Chief Complaint: Sleep consult  Brief History:  Peter Wang presents for an initial consult to establish care for sleep evaluation, with a reported at least a 3 year history of snoring and witnessed apnea.  Patient reports his sleep quality is fair. This is noted most nights. The patient's bed partner reports  snoring and witnessed apnea at night. The patient relates the following symptoms: slow to start in the morning, snoring that wakes him, EDS, brain fog and lack of focus are also present. The patient goes to sleep at 10:00pm-12:00am and wakes up at 6:00am and gets out of bed 8:00am. Sleep quality is the same when outside home environment.  Patient has noted does not compliance of his legs at night.  The patient  relates no unusual behavior during the night.  The patient anxiety as a history of psychiatric problems. The Epworth Sleepiness Score is 4 out of 24 .  The patient relates  Cardiovascular risk factors include: AICD, afib, HF, hx of CVA. The patient reports taking a daily nap of 1 hour after lunch.   ROS  General: (-) fever, (-) chills, (-) night sweat Nose and Sinuses: (-) nasal stuffiness or itchiness, (-) postnasal drip, (-) nosebleeds, (-) sinus trouble. Mouth and Throat: (-) sore throat, (-) hoarseness. Neck: (-) swollen glands, (-) enlarged thyroid, (-) neck pain. Respiratory: - cough, - shortness of breath, + wheezing. Neurologic: - numbness, - tingling. Psychiatric: + anxiety, - depression Sleep behavior: -sleep paralysis -hypnogogic hallucinations -dream enactment      -vivid dreams -cataplexy -night terrors -sleep walking   Current Medication: Outpatient Encounter Medications as of 01/12/2024  Medication Sig   albuterol-ipratropium (COMBIVENT) 18-103 MCG/ACT inhaler Inhale into the lungs every 4 (four) hours.   alclomethasone (ACLOVATE) 0.05 % cream Apply topically as needed.    allopurinol (ZYLOPRIM) 300 MG tablet Take 300 mg by mouth daily.   ALPRAZolam (XANAX) 0.25 MG tablet Take 0.25 mg by mouth at bedtime as needed for anxiety.   amiodarone (PACERONE) 200 MG tablet Take 200 mg by mouth as needed.   apixaban (ELIQUIS) 5 MG TABS tablet Take 5 mg by mouth as needed.   aspirin 81 MG chewable tablet Chew by mouth daily.   benzonatate (TESSALON) 100 MG capsule Take by mouth 3 (three) times daily as needed for cough.   calcium carbonate (OSCAL) 1500 (600 Ca) MG TABS tablet Take 600 mg of elemental calcium by mouth 4 (four) times daily.   carvedilol (COREG) 25 MG tablet Take 25 mg by mouth as needed.   chlorpheniramine (CHLOR-TRIMETON) 4 MG tablet Take 4 mg by mouth as needed for allergies.   cholecalciferol (VITAMIN D) 400 units TABS tablet Take 2,000 Units by mouth daily.   clindamycin (CLINDAGEL) 1 % gel Apply topically 2 (two) times daily.   cyanocobalamin 1000 MCG tablet Take 1,000 mcg by mouth daily.   desonide (DESOWEN) 0.05 % lotion Apply topically 2 (two) times daily.   digoxin (LANOXIN) 0.25 MG tablet Take 250 mcg by mouth daily.   diltiazem (DILACOR XR) 120 MG 24 hr capsule Take 120 mg by mouth daily.   docusate sodium (COLACE) 100 MG capsule Take 100 mg by mouth 2 (two) times daily.   doxycycline  (VIBRAMYCIN ) 100 MG capsule Take 1 capsule (100 mg total) by mouth 2 (two) times daily.   dronedarone (MULTAQ) 400 MG tablet Take 400 mg by mouth as needed.  enalapril (VASOTEC) 2.5 MG tablet Take 2.5 mg by mouth daily.   enoxaparin (LOVENOX) 100 MG/ML injection Inject 100 mg into the skin.   ferrous sulfate 325 (65 FE) MG tablet Take 325 mg by mouth daily with breakfast.   flecainide (TAMBOCOR) 50 MG tablet Take 50 mg by mouth 2 (two) times daily.   fluocinonide cream (LIDEX) 0.05 % Apply 1 application topically 2 (two) times daily.   fluorouracil (EFUDEX) 5 % cream Apply topically 2 (two) times daily.   fluticasone (FLONASE) 50 MCG/ACT nasal spray Place into both  nostrils daily.   Fluticasone-Salmeterol (ADVAIR) 250-50 MCG/DOSE AEPB Inhale 1 puff into the lungs 2 (two) times daily.   furosemide (LASIX) 40 MG tablet Take 40 mg by mouth.   HYDROcodone-acetaminophen  (NORCO/VICODIN) 5-325 MG tablet Take 1 tablet by mouth as needed for moderate pain.   hydrocortisone 2.5 % cream Apply topically 2 (two) times daily.   ketoconazole (NIZORAL) 2 % shampoo Apply 1 application topically as needed for irritation.   leuprolide, 6 Month, (ELIGARD) 45 MG injection Inject 45 mg into the skin every 6 (six) months.   levothyroxine (SYNTHROID, LEVOTHROID) 75 MCG tablet Take 75 mcg by mouth daily before breakfast.   loratadine (CLARITIN) 10 MG tablet Take 10 mg by mouth daily.   magnesium oxide (MAG-OX) 400 MG tablet Take 400 mg by mouth daily.   metoprolol succinate (TOPROL-XL) 50 MG 24 hr tablet Take 50 mg by mouth daily. Take with or immediately following a meal.   mexiletine (MEXITIL) 150 MG capsule Take 150 mg by mouth daily.   Miconazole-Zinc Oxide-Petrolat 0.25-15-81.35 % OINT Apply topically 2 (two) times daily.   nystatin (MYCOSTATIN/NYSTOP) powder Apply topically 2 (two) times daily.   omeprazole (PRILOSEC) 40 MG capsule Take 40 mg by mouth daily.   ranitidine (ZANTAC) 150 MG tablet Take 150 mg by mouth 2 (two) times daily.   salicylic acid 6 % gel Apply 1 application topically 2 (two) times daily.   Sulfacetamide Sodium-Sulfur 10-5 % LOTN Apply topically 3 times/day as needed-between meals & bedtime.   No facility-administered encounter medications on file as of 01/12/2024.    Surgical History: Past Surgical History:  Procedure Laterality Date   CARDIAC DEFIBRILLATOR PLACEMENT      Medical History: Past Medical History:  Diagnosis Date   Actinic keratosis    AICD (automatic cardioverter/defibrillator) present    Anemia    Anticoagulant long-term use    Arterial disease (HCC)    Atrial fibrillation (HCC)    Atrial flutter (HCC)    Automatic  implantable cardioverter-defibrillator in situ    Cancer (HCC)    Cancer of prostate (HCC)    Carotid artery disease (HCC)    Chronic kidney disease, stage III (moderate) (HCC)    Chronic systolic heart failure (HCC)    Decreased platelet count (HCC)    Disorder of arteries and arterioles (HCC)    Disorder of bone and cartilage    Encounter for biliary drainage tube placement    Gastroesophageal reflux disease    Gout    H/O malignant neoplasm of skin    H/O transient cerebral ischemia    Hypothyroidism    Impaired glucose tolerance    Malignant neoplasm of colon (HCC)    Malignant neoplasm of prostate (HCC)    Mitral valve disorder    Obstructive sleep apnea syndrome    Osteopenia    Paroxysmal ventricular tachycardia (HCC)    Polyp of colon    Restless leg syndrome  Seborrheic dermatitis    Thrombocytopenia (HCC)    TIA (transient ischemic attack)    Ventricular tachyarrhythmia (HCC)    Vitamin D deficiency     Family History: Non contributory to the present illness  Social History: Social History   Socioeconomic History   Marital status: Widowed    Spouse name: Not on file   Number of children: Not on file   Years of education: Not on file   Highest education level: Not on file  Occupational History   Not on file  Tobacco Use   Smoking status: Never   Smokeless tobacco: Never  Substance and Sexual Activity   Alcohol use: No   Drug use: No   Sexual activity: Not on file  Other Topics Concern   Not on file  Social History Narrative   Not on file   Social Drivers of Health   Financial Resource Strain: Unknown (08/22/2020)   Received from [facility]   Overall Financial Resource Strain (CARDIA)    Difficulty of Paying Living Expenses: Patient declined  Food Insecurity: Unknown (08/22/2020)   Received from [facility]   Hunger Vital Sign    Within the past 12 months, you worried that your food would run out before you got the money to buy more.:  Patient declined    Within the past 12 months, the food you bought just didn't last and you didn't have money to get more.: Patient declined  Transportation Needs: Unknown (08/22/2020)   Received from [facility] - Transportation    Lack of Transportation (Medical): Patient declined    Lack of Transportation (Non-Medical): Patient declined  Physical Activity: Inactive (08/22/2020)   Received from [facility]   Exercise Vital Sign    On average, how many days per week do you engage in moderate to strenuous exercise (like a brisk walk)?: 0 days    On average, how many minutes do you engage in exercise at this level?: 0 min  Stress: Unknown (08/22/2020)   Received from [facility] - Occupational Stress Questionnaire    Feeling of Stress : Patient declined  Social Connections: Unknown (10/06/2021)   Received from [facility]   Social Network    Social Network: Not on file  Intimate Partner Violence: Unknown (08/28/2021)   Received from [facility]   HITS    Physically Hurt: Not on file    Insult or Talk Down To: Not on file    Threaten Physical Harm: Not on file    Scream or Curse: Not on file    Vital Signs: Blood pressure 107/70, pulse 78, resp. rate 14, height 5' 7 (1.702 m), weight 183 lb 9.6 oz (83.3 kg), SpO2 98%. Body mass index is 28.76 kg/m.   Examination: General Appearance: The patient is well-developed, well-nourished, and in no distress. Neck Circumference: 39 cm Skin: Gross inspection of skin unremarkable. Head: normocephalic, no gross deformities. Eyes: no gross deformities noted. ENT: ears appear grossly normal Neurologic: Alert and oriented. No involuntary movements.    STOP BANG RISK ASSESSMENT S (snore) Have you been told that you snore?     YES   T (tired) Are you often tired, fatigued, or sleepy during the day?   YES  O (obstruction) Do you stop breathing, choke, or gasp during sleep? YES    P (pressure) Do you have or are you being treated for high blood pressure? NO   B (BMI) Is your  body index greater than 35 kg/m? NO   A (age) Are you 33 years old or older? YES   N (neck) Do you have a neck circumference greater than 16 inches?   NO   G (gender) Are you a male? YES   TOTAL STOP/BANG "YES" ANSWERS 5                                                               A STOP-Bang score of 2 or less is considered low risk, and a score of 5 or more is high risk for having either moderate or severe OSA. For people who score 3 or 4, doctors may need to perform further assessment to determine how likely they are to have OSA.         EPWORTH SLEEPINESS SCALE:  Scale:  (0)= no chance of dozing; (1)= slight chance of dozing; (2)= moderate chance of dozing; (3)= high chance of dozing  Chance  Situtation    Sitting and reading: 1    Watching TV: 1    Sitting Inactive in public: 0    As a passenger in car: 0      Lying down to rest: 2    Sitting and talking: 0    Sitting quielty after lunch: 0    In a car, stopped in traffic: 0   TOTAL SCORE:   4 out of 24    SLEEP STUDIES:  20 years ago at [facility]. Record not available.    LABS: No results found for this or any previous visit (from the past 2160 hours).  Radiology: DG Chest 2 View Result Date: 11/16/2016 CLINICAL DATA:  Cough and congestion. EXAM: CHEST  2 VIEW COMPARISON:  None. FINDINGS: Mild cardiomegaly. The hila and mediastinum are normal. A 2 lead AICD device is identified. No pneumothorax. A healed rib fractures seen laterally on the right. Underlying opacity is probably thickened pleural from previous trauma. There also appears to be opacity behind the right hemidiaphragm, located in the right lower lobe on the lateral view consistent with infiltrate. No other acute abnormalities are identified. IMPRESSION: Suspected right lower lobe pneumonia. Recommend short-term follow-up to ensure resolution. Healed  rib fractures on the right. Associated underlying opacity is likely pleural thickening. Electronically Signed   By: Alm Pouch III M.D   On: 11/16/2016 13:20    No results found.  No results found.    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 Patient has comorbid cardiovascular risk factors including: AICD, afib, HF, hx of CVA which could be exacerbated by pathologic sleep-disordered breathing.  Suggest: PSG to assess/treat the patient's sleep disordered breathing. The patient was also counselled on wt loss to optimize  sleep health.   1. Hypersomnia (Primary) Will order PSG  2. Chronic systolic heart failure (HCC) Followed by cardiology  3. Paroxysmal atrial fibrillation (HCC) Followed by cardiology  4. Implantable cardioverter-defibrillator (ICD) in situ Followed by cardiology  5. History of CVA (cerebrovascular accident) Followed by PCP   General Counseling: I have discussed the findings of the evaluation and examination with [facility].  I have also discussed any further diagnostic evaluation thatmay be needed or ordered today. Leyton verbalizes understanding of the findings of todays visit. We also reviewed his medications today and discussed drug interactions and side effects including but not limited excessive drowsiness and altered mental states. We also discussed that there is always  a risk not just to him but also people around him. he has been encouraged to call the office with any questions or concerns that should arise related to todays visit.  No orders of the defined types were placed in this encounter.       I have personally obtained a history, evaluated the patient, evaluated pertinent data, formulated the assessment and plan and placed orders.  This patient was seen by Tinnie Pro, PA-C in collaboration with Dr. Elfreda Bathe as a part of collaborative care agreement.    Elfreda DELENA Bathe, MD [facility] Diplomate ABMS Pulmonary and Critical Care Medicine Sleep medicine

## 2024-01-12 ENCOUNTER — Ambulatory Visit (INDEPENDENT_AMBULATORY_CARE_PROVIDER_SITE_OTHER): Admitting: Internal Medicine

## 2024-01-12 VITALS — BP 107/70 | HR 78 | Resp 14 | Ht 67.0 in | Wt 183.6 lb

## 2024-01-12 DIAGNOSIS — G4710 Hypersomnia, unspecified: Secondary | ICD-10-CM | POA: Diagnosis not present

## 2024-01-12 DIAGNOSIS — Z8673 Personal history of transient ischemic attack (TIA), and cerebral infarction without residual deficits: Secondary | ICD-10-CM

## 2024-01-12 DIAGNOSIS — I5022 Chronic systolic (congestive) heart failure: Secondary | ICD-10-CM

## 2024-01-12 DIAGNOSIS — Z95810 Presence of automatic (implantable) cardiac defibrillator: Secondary | ICD-10-CM

## 2024-01-12 DIAGNOSIS — I480 Paroxysmal atrial fibrillation: Secondary | ICD-10-CM | POA: Diagnosis not present

## 2024-05-28 NOTE — Progress Notes (Signed)
 [facility] [address] [place], [place] [address]  Pulmonary Sleep Medicine   Office Visit Note  Patient Name: Peter Wang DOB: March 28, 1941 MRN [id number]    Chief Complaint: Obstructive Sleep Apnea visit  Brief History:  Peter Wang is seen today for a follow Peter after set Peter on CPAP@ 6 cmH20. The patient has a 3 month history of sleep apnea. Patient is not using PAP nightly.  The patient feels the same after sleeping with PAP.  The patient reports some benefit from PAP use. Reported sleepiness is not improved and the Epworth Sleepiness Score is 9 out of 24. The patient does take naps. The patient complains of the following: mask waking him Peter, oral venting. Full Face mask needed and to be scheduled.  The compliance download shows 10% compliance with an average use time of 1 hours. The AHI is 17.3  The patient does complain of limb movements, but states it generally does not  disrupt sleep.  ROS  General: (-) fever, (-) chills, (-) night sweat Nose and Sinuses: (-) nasal stuffiness or itchiness, (-) postnasal drip, (-) nosebleeds, (-) sinus trouble. Mouth and Throat: (-) sore throat, (-) hoarseness. Neck: (-) swollen glands, (-) enlarged thyroid, (-) neck pain. Respiratory: + cough, - shortness of breath, - wheezing. Neurologic: - numbness, - tingling. Psychiatric: + anxiety, - depression   Current Medication: Outpatient Encounter Medications as of 05/30/2024  Medication Sig   albuterol-ipratropium (COMBIVENT) 18-103 MCG/ACT inhaler Inhale into the lungs every 4 (four) hours.   allopurinol (ZYLOPRIM) 300 MG tablet Take 300 mg by mouth daily.   ALPRAZolam (XANAX) 0.25 MG tablet Take 0.25 mg by mouth at bedtime as needed for anxiety.   apixaban (ELIQUIS) 5 MG TABS tablet Take 5 mg by mouth as needed.   benzonatate (TESSALON) 100 MG capsule Take by mouth 3 (three) times daily as needed for cough.   calcium carbonate (OSCAL) 1500 (600 Ca) MG TABS tablet Take 600 mg of  elemental calcium by mouth 4 (four) times daily.   carvedilol (COREG) 25 MG tablet Take 25 mg by mouth as needed.   cholecalciferol (VITAMIN D) 400 units TABS tablet Take 2,000 Units by mouth daily.   clindamycin (CLINDAGEL) 1 % gel Apply topically 2 (two) times daily.   cyanocobalamin 1000 MCG tablet Take 1,000 mcg by mouth daily.   desonide (DESOWEN) 0.05 % lotion Apply topically 2 (two) times daily.   docusate sodium (COLACE) 100 MG capsule Take 100 mg by mouth 2 (two) times daily.   enalapril (VASOTEC) 2.5 MG tablet Take 2.5 mg by mouth daily.   escitalopram (LEXAPRO) 10 MG tablet TAKE 1 TABLET EVERY DAY BY ORAL ROUTE IN THE MORNING FOR 90 DAYS, FOR ANXIETY.   FARXIGA 10 MG TABS tablet Take 10 mg by mouth daily.   ferrous sulfate 325 (65 FE) MG tablet Take 325 mg by mouth daily with breakfast.   fluocinonide cream (LIDEX) 0.05 % Apply 1 application topically 2 (two) times daily.   fluorouracil (EFUDEX) 5 % cream Apply topically 2 (two) times daily.   fluticasone (FLONASE) 50 MCG/ACT nasal spray Place into both nostrils daily.   Fluticasone-Salmeterol (ADVAIR) 250-50 MCG/DOSE AEPB Inhale 1 puff into the lungs 2 (two) times daily.   furosemide (LASIX) 40 MG tablet Take 40 mg by mouth.   ketoconazole (NIZORAL) 2 % shampoo Apply 1 application topically as needed for irritation.   leuprolide, 6 Month, (ELIGARD) 45 MG injection Inject 45 mg into the skin every 6 (  six) months.   levothyroxine (SYNTHROID, LEVOTHROID) 75 MCG tablet Take 75 mcg by mouth daily before breakfast.   loratadine (CLARITIN) 10 MG tablet Take 10 mg by mouth daily.   magnesium oxide (MAG-OX) 400 MG tablet Take 400 mg by mouth daily.   mexiletine (MEXITIL) 150 MG capsule Take 150 mg by mouth daily.   Miconazole-Zinc Oxide-Petrolat 0.25-15-81.35 % OINT Apply topically 2 (two) times daily.   nystatin (MYCOSTATIN/NYSTOP) powder Apply topically 2 (two) times daily.   omeprazole (PRILOSEC) 40 MG capsule Take 40 mg by mouth daily.    rosuvastatin (CRESTOR) 10 MG tablet TAKE 1 TAB BY MOUTH AT BEDTIME   salicylic acid 6 % gel Apply 1 application topically 2 (two) times daily.   Sulfacetamide Sodium-Sulfur 10-5 % LOTN Apply topically 3 times/day as needed-between meals & bedtime.   [DISCONTINUED] aspirin 81 MG chewable tablet Chew by mouth daily.   No facility-administered encounter medications on file as of 05/30/2024.    Surgical History: Past Surgical History:  Procedure Laterality Date   CARDIAC DEFIBRILLATOR PLACEMENT      Medical History: Past Medical History:  Diagnosis Date   Actinic keratosis    AICD (automatic cardioverter/defibrillator) present    Anemia    Anticoagulant long-term use    Arterial disease    Atrial fibrillation (HCC)    Atrial flutter (HCC)    Automatic implantable cardioverter-defibrillator in situ    Cancer (HCC)    Cancer of prostate (HCC)    Carotid artery disease    Chronic kidney disease, stage III (moderate) (HCC)    Chronic systolic heart failure (HCC)    Decreased platelet count    Disorder of arteries and arterioles    Disorder of bone and cartilage    Encounter for biliary drainage tube placement    Gastroesophageal reflux disease    Gout    H/O malignant neoplasm of skin    H/O transient cerebral ischemia    Hypothyroidism    Impaired glucose tolerance    Malignant neoplasm of colon (HCC)    Malignant neoplasm of prostate (HCC)    Mitral valve disorder    Obstructive sleep apnea syndrome    Osteopenia    Paroxysmal ventricular tachycardia (HCC)    Polyp of colon    Restless leg syndrome    Seborrheic dermatitis    Thrombocytopenia    TIA (transient ischemic attack)    Ventricular tachyarrhythmia (HCC)    Vitamin D deficiency     Family History: Non contributory to the present illness  Social History: Social History   Socioeconomic History   Marital status: Widowed    Spouse name: Not on file   Number of children: Not on file   Years of  education: Not on file   Highest education level: Not on file  Occupational History   Not on file  Tobacco Use   Smoking status: Never   Smokeless tobacco: Never  Substance and Sexual Activity   Alcohol use: No   Drug use: No   Sexual activity: Not on file  Other Topics Concern   Not on file  Social History Narrative   Not on file   Social Drivers of Health   Tobacco Use: Low Risk (05/30/2024)   Patient History    Smoking Tobacco Use: Never    Smokeless Tobacco Use: Never    Passive Exposure: Not on file  Financial Resource Strain: Not on file  Food Insecurity: Not on file  Transportation Needs: Not on file  Physical Activity: Not on file  Stress: Not on file  Social Connections: Unknown (10/06/2021)   Received from [facility]   Social Network    Social Network: Not on file  Intimate Partner Violence: Unknown (08/28/2021)   Received from [facility]   HITS    Physically Hurt: Not on file    Insult or Talk Down To: Not on file    Threaten Physical Harm: Not on file    Scream or Curse: Not on file  Depression (EYV7-0): Not on file  Alcohol Screen: Not on file  Housing: Unknown (03/11/2024)   Received from [facility]   Epic    Unable to Pay for Housing in the Last Year: Not on file    Number of Times Moved in the Last Year: Not on file    At any time in the past 12 months, were you homeless or living in a shelter (including now)?: No  Utilities: Not on file  Health Literacy: Not on file    Vital Signs: Blood pressure 127/80, pulse 81, resp. rate 12, height 5' 4 (1.626 m), weight 179 lb (81.2 kg), SpO2 100%. Body mass index is 30.73 kg/m.    Examination: General Appearance: The patient is well-developed, well-nourished, and in no distress. Neck Circumference: 41cm Skin: Gross inspection of skin unremarkable. Head: normocephalic, no gross deformities. Eyes: no gross deformities noted. ENT: ears appear grossly normal Neurologic: Alert and  oriented. No involuntary movements.  STOP BANG RISK ASSESSMENT S (snore) Have you been told that you snore?     YES   T (tired) Are you often tired, fatigued, or sleepy during the day?   YES  O (obstruction) Do you stop breathing, choke, or gasp during sleep? YES   P (pressure) Do you have or are you being treated for high blood pressure? YES   B (BMI) Is your body index greater than 35 kg/m? NO   A (age) Are you 80 years old or older? YES   N (neck) Do you have a neck circumference greater than 16 inches?   NO   G (gender) Are you a male? YES   TOTAL STOP/BANG YES ANSWERS 6       A STOP-Bang score of 2 or less is considered low risk, and a score of 5 or more is high risk for having either moderate or severe OSA. For people who score 3 or 4, doctors may need to perform further assessment to determine how likely they are to have OSA.         EPWORTH SLEEPINESS SCALE:  Scale:  (0)= no chance of dozing; (1)= slight chance of dozing; (2)= moderate chance of dozing; (3)= high chance of dozing  Chance  Situtation    Sitting and reading: 2    Watching TV: 2    Sitting Inactive in public: 1    As a passenger in car: 0      Lying down to rest: 2    Sitting and talking: 0    Sitting quielty after lunch: 2    In a car, stopped in traffic: 0   TOTAL SCORE:   9 out of 24    SLEEP STUDIES:  PSG (02/2024) AHI 15.8/hr, Supine 48.7/hr, Min Sp02 86% Titration (02/2024) CPAP@ 6 cmH20   CPAP COMPLIANCE DATA:  Date Range: 04/12/2024 - 05/11/2024  Average Daily Use: 6 hours 22 minutes  Median Use: 7 hours  Compliance for > 4 Hours: 10% days  AHI:  17.3 respiratory events per hour  Days Used: 5/30  Mask Leak: 56.9  95th Percentile Pressure: 6 cmH20         LABS: No results found for this or any previous visit (from the past 2160 hours).  Radiology: DG Chest 2 View Result Date: 11/16/2016 CLINICAL DATA:  Cough and congestion. EXAM: CHEST  2 VIEW  COMPARISON:  None. FINDINGS: Mild cardiomegaly. The hila and mediastinum are normal. A 2 lead AICD device is identified. No pneumothorax. A healed rib fractures seen laterally on the right. Underlying opacity is probably thickened pleural from previous trauma. There also appears to be opacity behind the right hemidiaphragm, located in the right lower lobe on the lateral view consistent with infiltrate. No other acute abnormalities are identified. IMPRESSION: Suspected right lower lobe pneumonia. Recommend short-term follow-Peter to ensure resolution. Healed rib fractures on the right. Associated underlying opacity is likely pleural thickening. Electronically Signed   By: Alm Pouch III M.D   On: 11/16/2016 13:20    No results found.  No results found.    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 OSA (obstructive sleep apnea) (Primary) The patient is struggling to  tolerate PAP and reports no benefit from PAP use. His compliance has been extremely poor. He is struggling with leak and how to use the equipment. He will be scheduled for a mask fitting and a reeducation. 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 I explained the risks of untreated apnea. The patient was reminded how to clean equipment and advised to replace supplies routinely. The patient was also counselled on weight loss. The compliance is poor. The AHI is 17.3, I suspect this will improve with control of the leak.   OSA- increase compliance, mask fitting,  reeducation. F/u 6wk   2. CPAP use counseling CPAP Counseling: had a lengthy discussion with the patient regarding the importance of PAP therapy in management of the sleep apnea. Patient appears to understand the risk factor reduction and also understands the risks associated with untreated sleep apnea. Patient will try to make a good faith effort to remain compliant with therapy. Also instructed the patient on proper cleaning of the device including the water must be changed daily if possible and use of distilled water is preferred. Patient understands that the machine should be regularly cleaned with appropriate recommended cleaning solutions that do not damage the PAP machine for example given white vinegar and water rinses. Other methods such as ozone treatment may not be as good as these simple methods to achieve cleaning.   3. History of CVA (cerebrovascular accident) Follows with primary provider  4. Paroxysmal atrial fibrillation (HCC) Continue f/u with cardiology.     General Counseling: I have discussed the findings of the evaluation and examination with Peter Wang.  I have also discussed any further diagnostic evaluation thatmay be needed or ordered today. Peter Wang verbalizes understanding of the findings of todays visit. We also reviewed his medications today and discussed drug interactions and side effects including but not limited excessive drowsiness and altered mental states. We also  discussed that there is always a risk not just to him but also people around him. he has been encouraged to call the office with any questions or concerns that should arise related to todays visit.  No orders of the defined types were placed in this encounter.       I have personally obtained a history, examined the patient, evaluated laboratory and imaging results, formulated the assessment and plan and placed orders. This patient was seen today by Lauraine Lay, PA-C in collaboration with Dr. Elfreda Bathe.    Elfreda DELENA Bathe, MD [facility] Diplomate ABMS Pulmonary Critical Care Medicine and Sleep Medicine

## 2024-05-30 ENCOUNTER — Ambulatory Visit: Admitting: Internal Medicine

## 2024-05-30 VITALS — BP 127/80 | HR 81 | Resp 12 | Ht 64.0 in | Wt 179.0 lb

## 2024-05-30 DIAGNOSIS — Z7189 Other specified counseling: Secondary | ICD-10-CM

## 2024-05-30 DIAGNOSIS — G4733 Obstructive sleep apnea (adult) (pediatric): Secondary | ICD-10-CM

## 2024-05-30 DIAGNOSIS — Z8673 Personal history of transient ischemic attack (TIA), and cerebral infarction without residual deficits: Secondary | ICD-10-CM

## 2024-05-30 DIAGNOSIS — I480 Paroxysmal atrial fibrillation: Secondary | ICD-10-CM

## 2024-05-30 NOTE — Patient Instructions (Signed)
